# Patient Record
Sex: Female | Born: 1945 | Race: White | Hispanic: No
Health system: Southern US, Community
[De-identification: ages and names within clinical notes are randomized; demographics above are authoritative.]

## PROBLEM LIST (undated history)

## (undated) DIAGNOSIS — F329 Major depressive disorder, single episode, unspecified: Secondary | ICD-10-CM

## (undated) DIAGNOSIS — F32A Depression, unspecified: Secondary | ICD-10-CM

## (undated) HISTORY — DX: Depression, unspecified: F32.A

## (undated) HISTORY — DX: Major depressive disorder, single episode, unspecified: F32.9

---

## 1997-10-19 ENCOUNTER — Ambulatory Visit (HOSPITAL_COMMUNITY): Admission: RE | Admit: 1997-10-19 | Discharge: 1997-10-19 | Payer: Self-pay | Admitting: Orthopedic Surgery

## 1997-11-05 ENCOUNTER — Ambulatory Visit (HOSPITAL_BASED_OUTPATIENT_CLINIC_OR_DEPARTMENT_OTHER): Admission: RE | Admit: 1997-11-05 | Discharge: 1997-11-05 | Payer: Self-pay | Admitting: Orthopedic Surgery

## 1997-11-12 ENCOUNTER — Encounter: Admission: RE | Admit: 1997-11-12 | Discharge: 1998-02-10 | Payer: Self-pay | Admitting: Orthopedic Surgery

## 1998-01-09 ENCOUNTER — Ambulatory Visit (HOSPITAL_BASED_OUTPATIENT_CLINIC_OR_DEPARTMENT_OTHER): Admission: RE | Admit: 1998-01-09 | Discharge: 1998-01-09 | Payer: Self-pay | Admitting: Orthopedic Surgery

## 1998-09-25 ENCOUNTER — Other Ambulatory Visit: Admission: RE | Admit: 1998-09-25 | Discharge: 1998-09-25 | Payer: Self-pay | Admitting: Obstetrics and Gynecology

## 1999-07-20 ENCOUNTER — Encounter: Payer: Self-pay | Admitting: Oncology

## 1999-07-20 ENCOUNTER — Encounter: Admission: RE | Admit: 1999-07-20 | Discharge: 1999-07-20 | Payer: Self-pay | Admitting: Oncology

## 1999-08-12 ENCOUNTER — Encounter: Payer: Self-pay | Admitting: Emergency Medicine

## 1999-08-12 ENCOUNTER — Emergency Department (HOSPITAL_COMMUNITY): Admission: EM | Admit: 1999-08-12 | Discharge: 1999-08-12 | Payer: Self-pay | Admitting: Emergency Medicine

## 2000-02-26 ENCOUNTER — Encounter: Payer: Self-pay | Admitting: Emergency Medicine

## 2000-02-27 ENCOUNTER — Encounter: Payer: Self-pay | Admitting: Emergency Medicine

## 2000-02-27 ENCOUNTER — Observation Stay (HOSPITAL_COMMUNITY): Admission: EM | Admit: 2000-02-27 | Discharge: 2000-02-27 | Payer: Self-pay | Admitting: Emergency Medicine

## 2000-07-20 ENCOUNTER — Encounter: Payer: Self-pay | Admitting: Oncology

## 2000-07-20 ENCOUNTER — Encounter: Admission: RE | Admit: 2000-07-20 | Discharge: 2000-07-20 | Payer: Self-pay | Admitting: Oncology

## 2001-01-23 ENCOUNTER — Encounter: Admission: RE | Admit: 2001-01-23 | Discharge: 2001-01-23 | Payer: Self-pay | Admitting: Internal Medicine

## 2001-01-23 ENCOUNTER — Encounter: Payer: Self-pay | Admitting: Internal Medicine

## 2001-07-26 ENCOUNTER — Encounter: Payer: Self-pay | Admitting: Oncology

## 2001-07-26 ENCOUNTER — Encounter: Admission: RE | Admit: 2001-07-26 | Discharge: 2001-07-26 | Payer: Self-pay | Admitting: Oncology

## 2002-06-12 ENCOUNTER — Other Ambulatory Visit: Admission: RE | Admit: 2002-06-12 | Discharge: 2002-06-12 | Payer: Self-pay | Admitting: Obstetrics and Gynecology

## 2002-07-27 ENCOUNTER — Encounter: Payer: Self-pay | Admitting: Internal Medicine

## 2002-07-27 ENCOUNTER — Encounter: Admission: RE | Admit: 2002-07-27 | Discharge: 2002-07-27 | Payer: Self-pay | Admitting: Internal Medicine

## 2003-05-03 ENCOUNTER — Encounter: Admission: RE | Admit: 2003-05-03 | Discharge: 2003-05-03 | Payer: Self-pay | Admitting: Internal Medicine

## 2003-07-30 ENCOUNTER — Encounter: Admission: RE | Admit: 2003-07-30 | Discharge: 2003-07-30 | Payer: Self-pay | Admitting: Internal Medicine

## 2003-08-08 ENCOUNTER — Ambulatory Visit (HOSPITAL_COMMUNITY): Admission: RE | Admit: 2003-08-08 | Discharge: 2003-08-08 | Payer: Self-pay | Admitting: Gastroenterology

## 2003-09-17 ENCOUNTER — Ambulatory Visit (HOSPITAL_COMMUNITY): Admission: RE | Admit: 2003-09-17 | Discharge: 2003-09-17 | Payer: Self-pay | Admitting: General Surgery

## 2004-04-16 IMAGING — CT CT PELVIS W/ CM
1 series · 15 of 32 positions shown, 19 images · IV contrast (GASTRO. & OMNIPAQUE [ID])
Comparison: none

CLINICAL DATA: Abdominal pain, nausea, weight loss.  Multiple drug allergies.  Hypertension.  Breast cancer.  History of renal calculi.  Surgery left kidney for stones.  CON ? V14.8.
 CT ABDOMEN WITH CONTRAST
 Following oral contrast and IV administration of 100 cc of Omnipaque 300, multidetector spiral axial images through the abdomen, when compared with the prior study of 01/23/01, shows lesser fatty change of the liver.  Currently, a 13 mm posterior superior right lobe of the liver focus is seen with incidental hemangioma (image 15-16 which shows slight filling in and hyperdensity on delayed image 93).  No other focal liver lesions are seen.   Old appearing lateral probable right 8th rib fracture deformity is seen with no other osseous lesions noted.  The remaining abdominal organs appear normal with no new inflammation nor adenopathy.  
 IMPRESSION
 Since 01/23/01:
   Left diffuse      fatty infiltration of the liver with right lobe liver focus consistent      with incidental 13 mm hemangioma.
  Right lateral      old appearing rib fracture deformity ? probable 8th.
  Otherwise      normal.

[Series 2: — · axial · 0.70mm/px · z∈[-421,-36]mm · 15 of 119 slices shown, 19 images]
[im 8/119  soft-tissue]
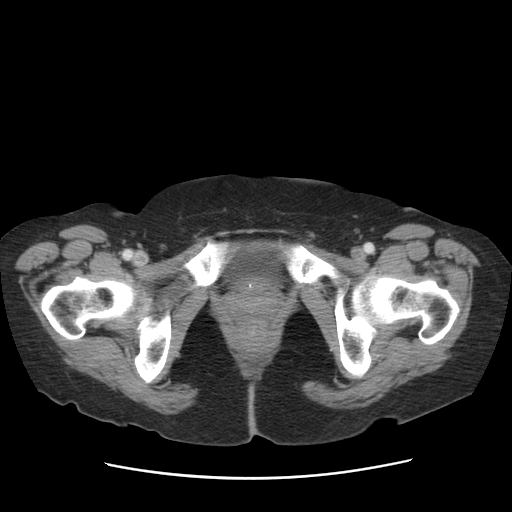
[im 8/119  bone]
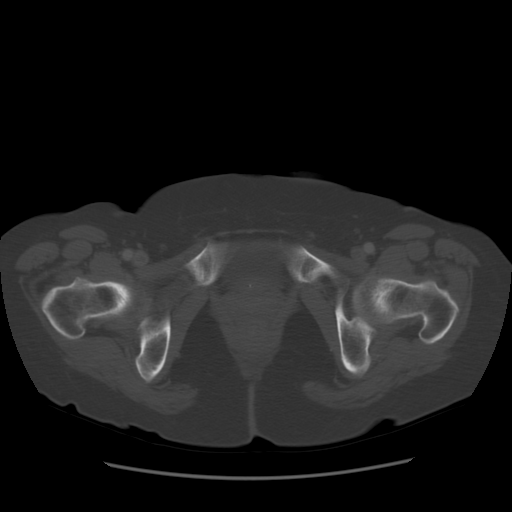
[im 16/119  soft-tissue]
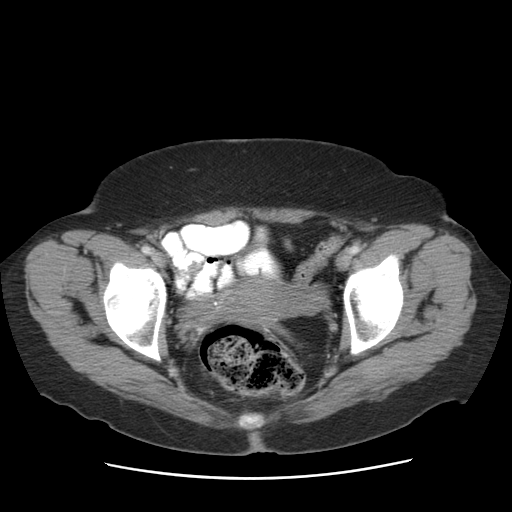
[im 23/119  soft-tissue]
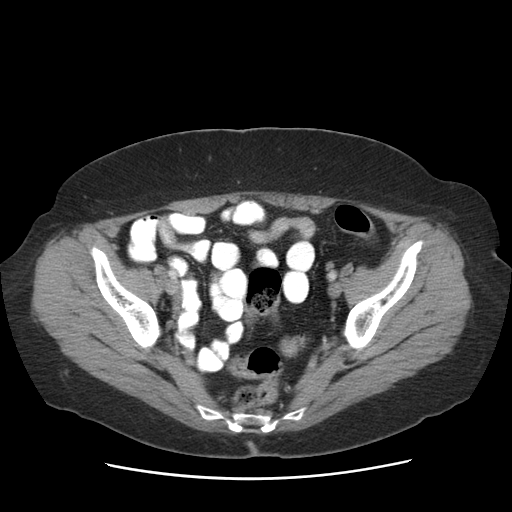
[im 35/119  soft-tissue]
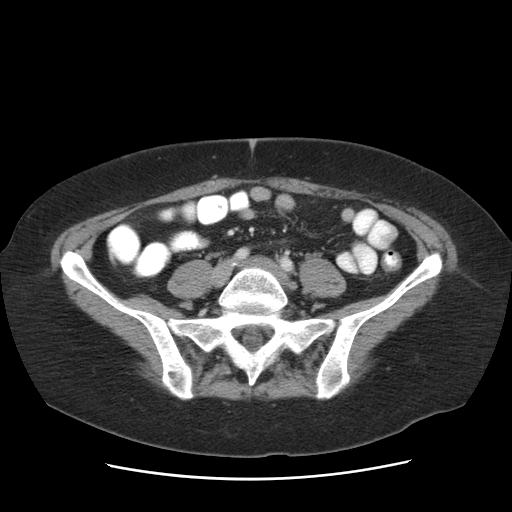
[im 42/119  soft-tissue]
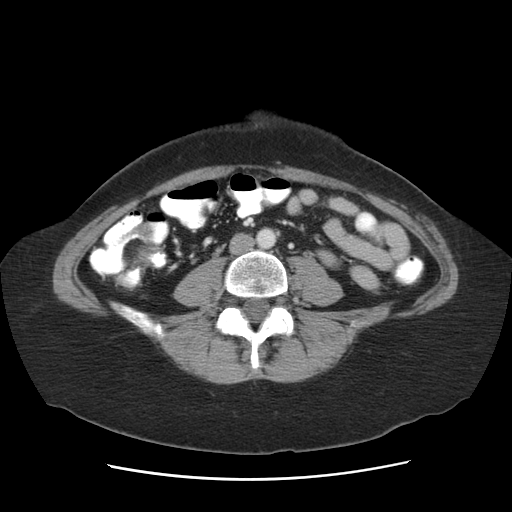
[im 50/119  soft-tissue]
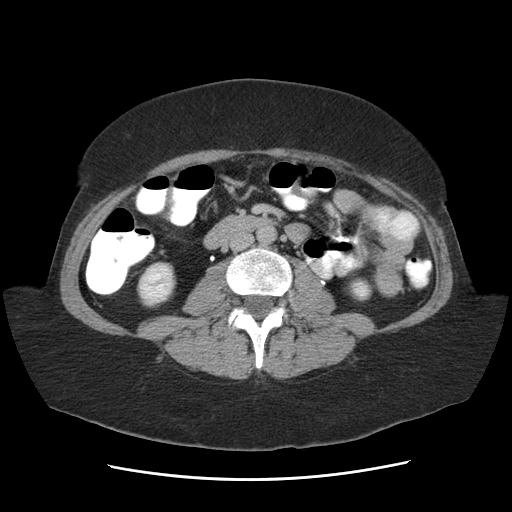
[im 61/119  soft-tissue]
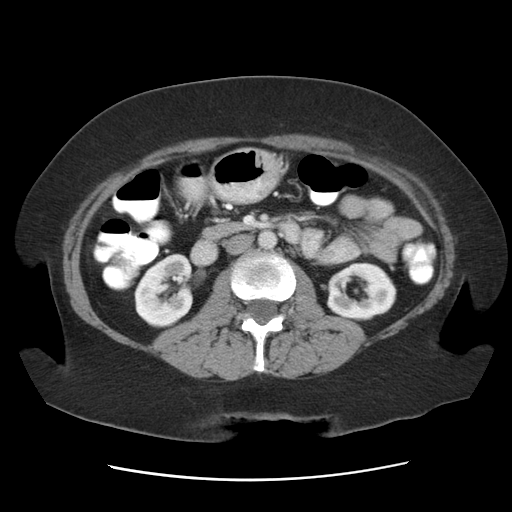
[im 69/119  soft-tissue]
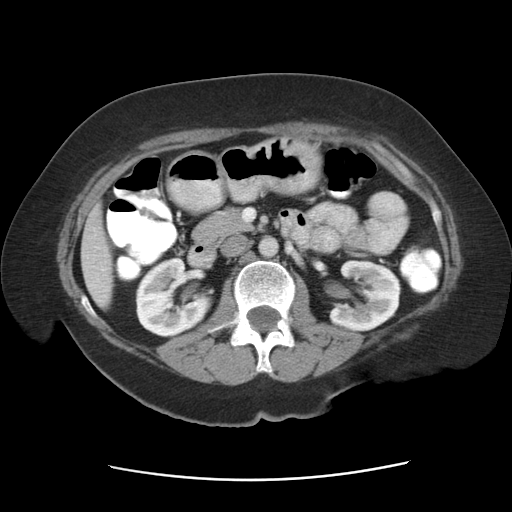
[im 77/119  soft-tissue]
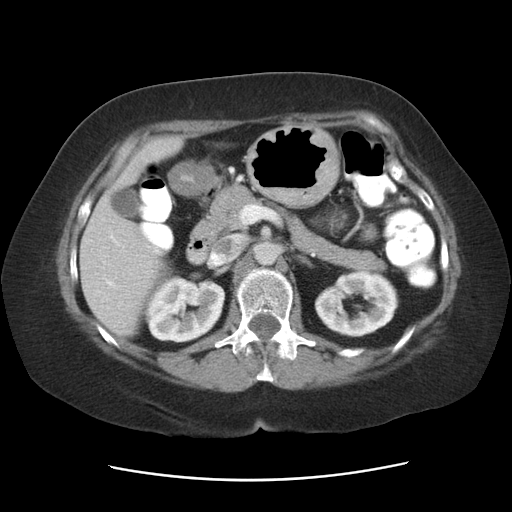
[im 77/119  bone]
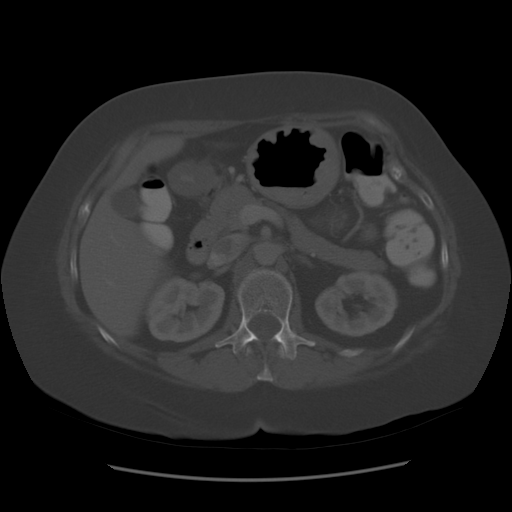
[im 84/119  soft-tissue]
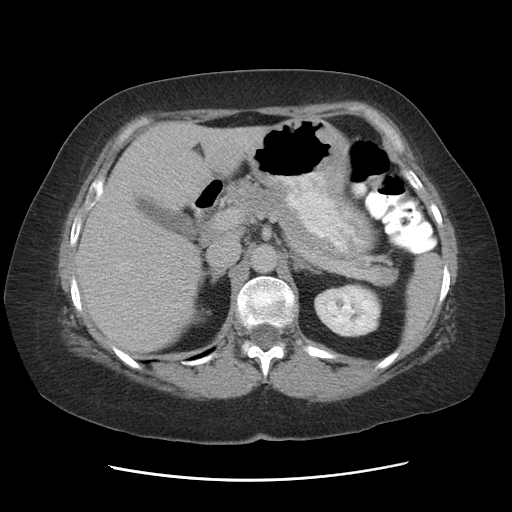
[im 96/119  soft-tissue]
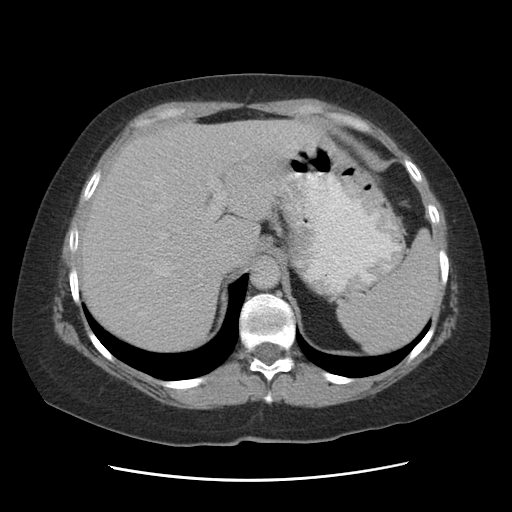
[im 103/119  soft-tissue]
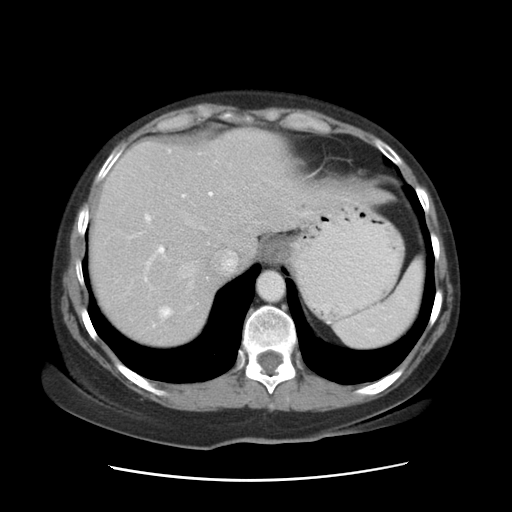
[im 103/119  lung]
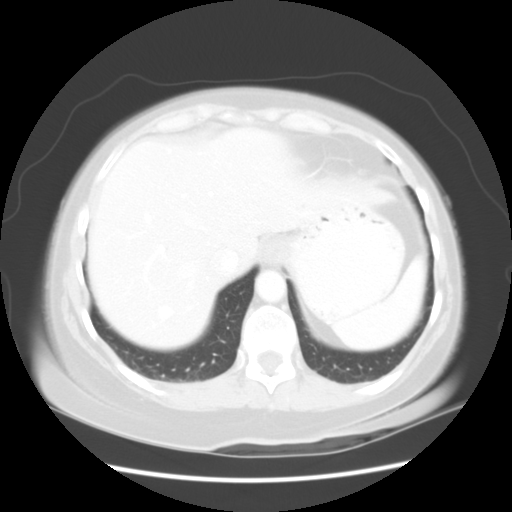
[im 107/119  lung]
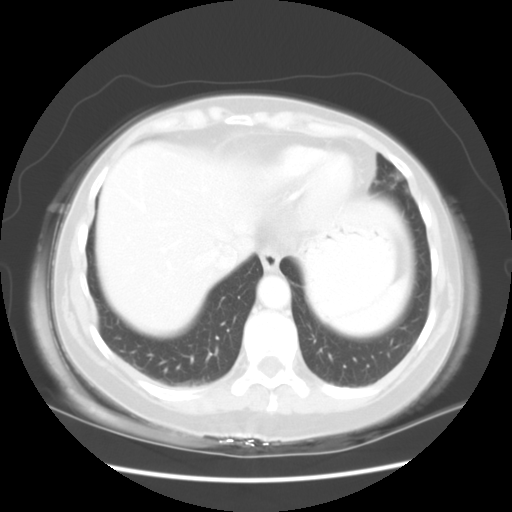
[im 111/119  soft-tissue]
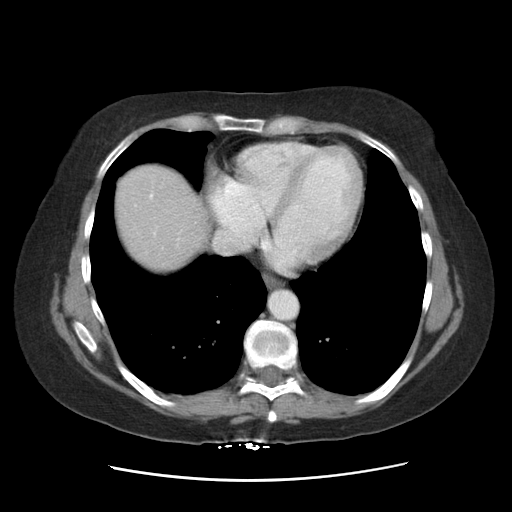
[im 111/119  lung]
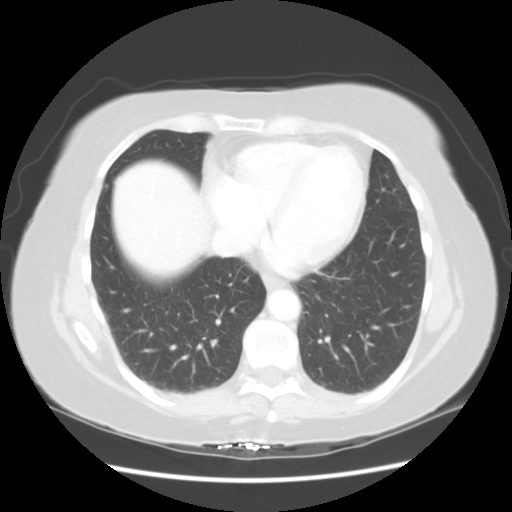
[im 115/119  lung]
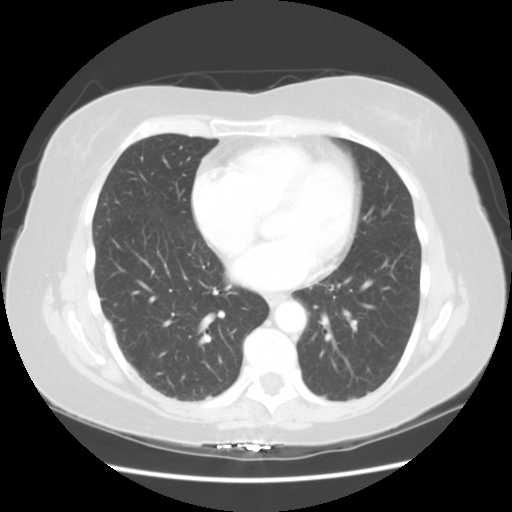

[15 of 32 positions shown; findings below may reference images not displayed]

CT PELVIS WITH CONTRAST
 Routine spiral CT of the pelvis was performed.  Omnipaque intravenous contrast and oral contrast were administered.
 The pelvic structures are normal in appearance.  There is no evidence of masses, adenopathy, inflammatory process, or abnormal fluid collections.  No interval change since 01/23/01.
 IMPRESSION
 Normal pelvis CT.

## 2004-08-05 ENCOUNTER — Encounter: Admission: RE | Admit: 2004-08-05 | Discharge: 2004-08-05 | Payer: Self-pay | Admitting: Internal Medicine

## 2004-08-27 IMAGING — CR DG CHEST 2V
2 series · 2 of 2 positions shown · non-contrast
Comparison: 02/26/00.

CLINICAL DATA: Preoperative respiratory exam for anal fissures.
 CHEST, TWO VIEWS 09/13/03

[view not recorded (1 of 2)]
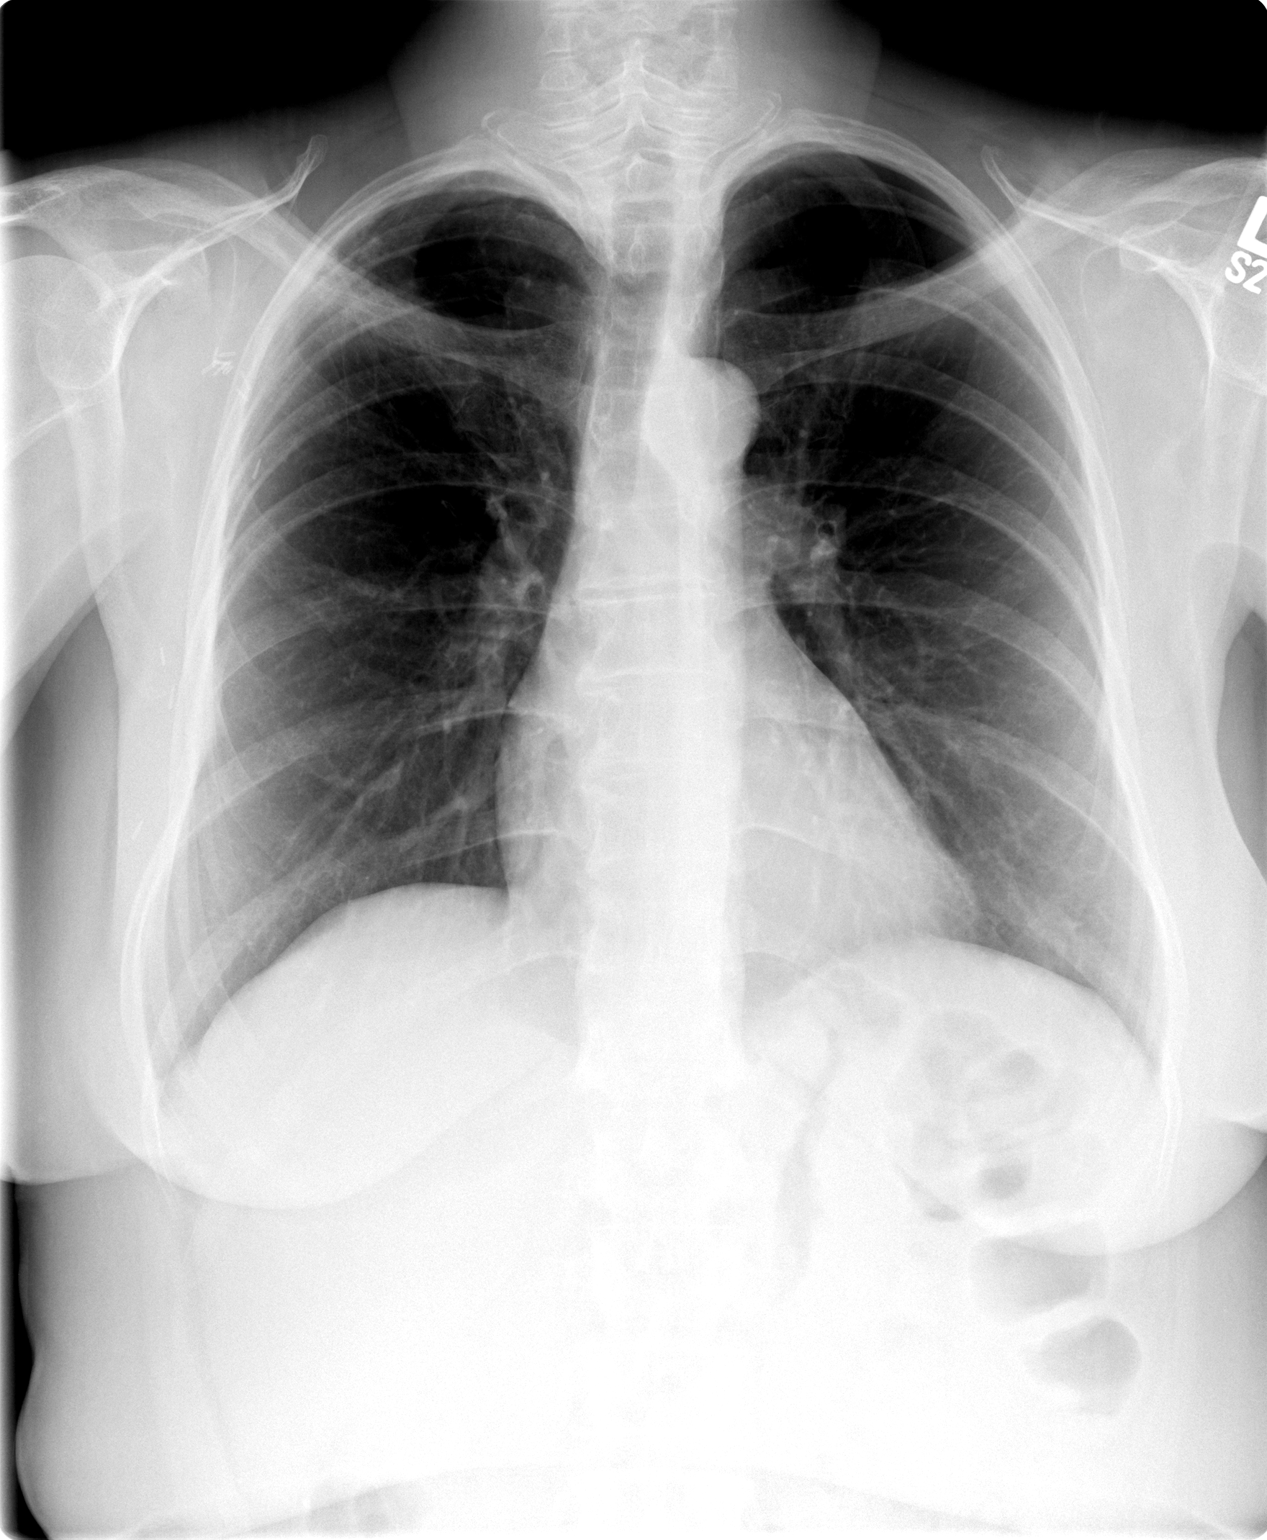

[view not recorded (2 of 2)]
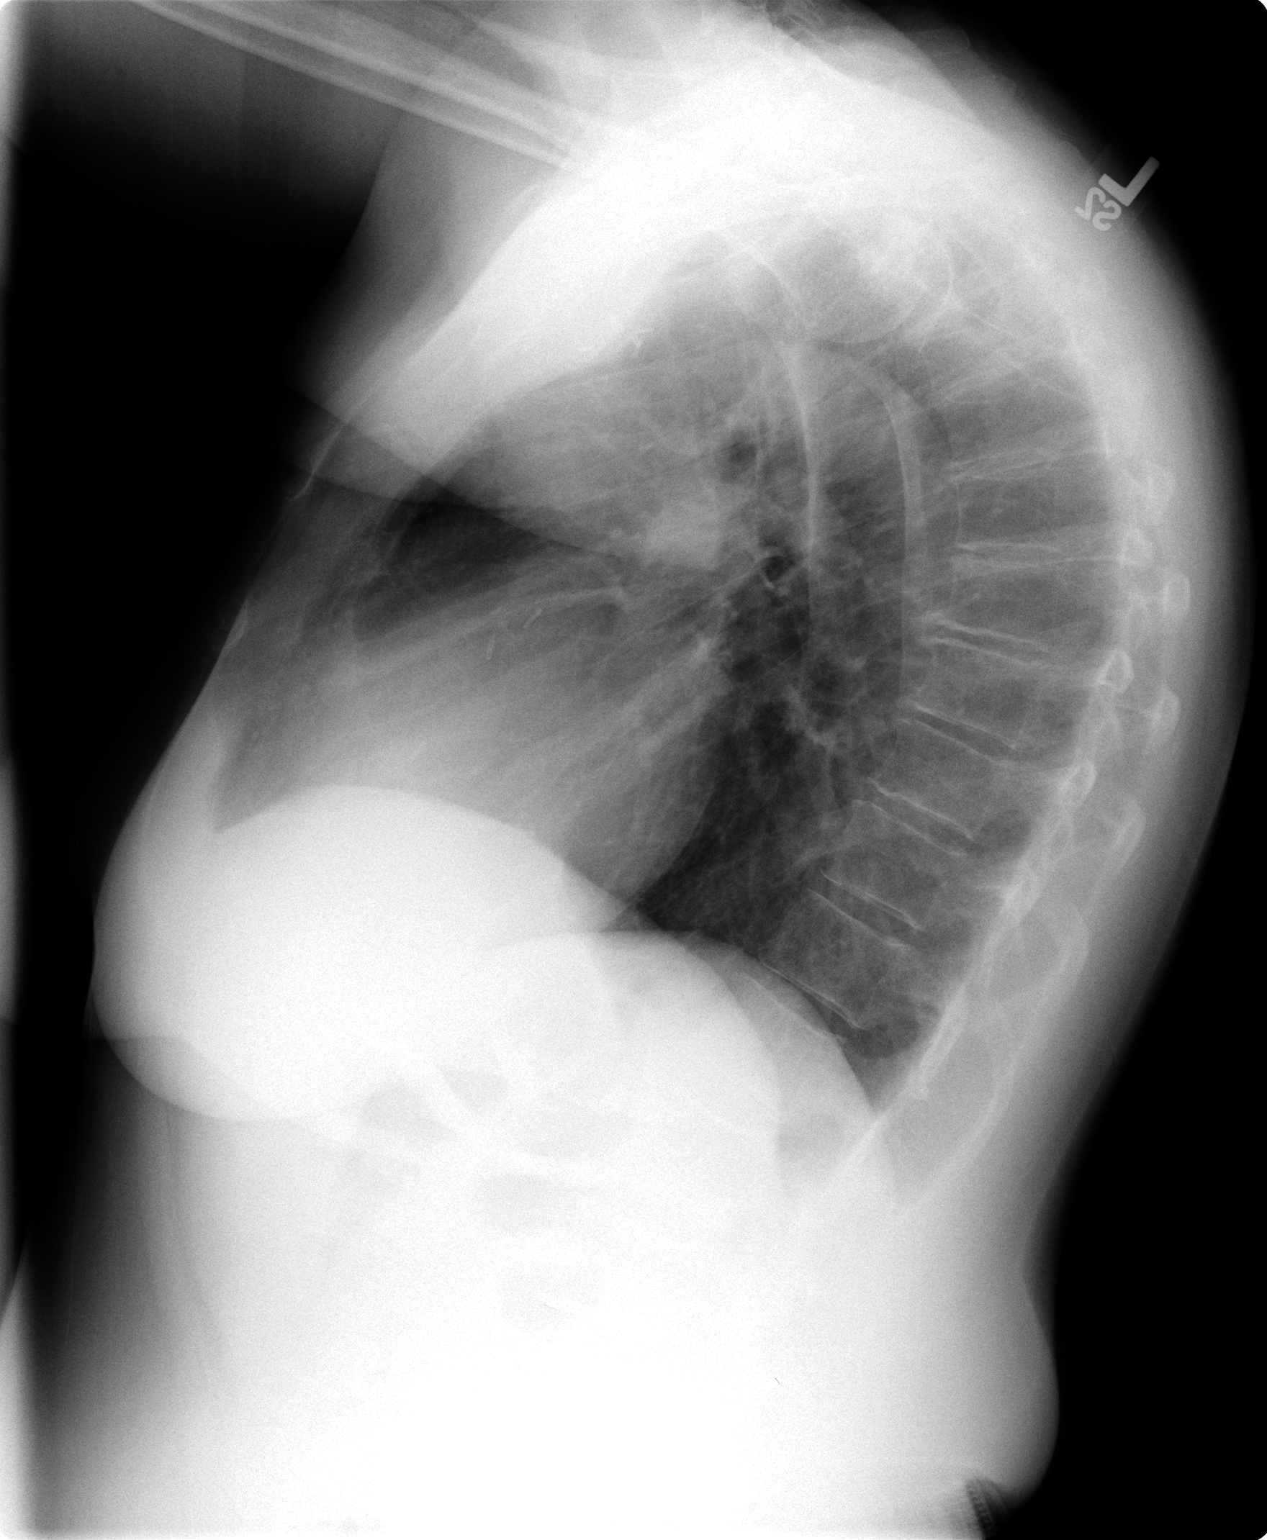

[2 of 2 positions shown; findings below may reference images not displayed]

Stable appearance of the chest without evidence of infiltrate, edema, nodule, or pleural effusion.  The heart size is normal.  Visualized bony thorax is unremarkable. 
 IMPRESSION
 No active disease.

## 2005-08-09 ENCOUNTER — Encounter: Admission: RE | Admit: 2005-08-09 | Discharge: 2005-08-09 | Payer: Self-pay | Admitting: Obstetrics and Gynecology

## 2006-08-19 ENCOUNTER — Encounter: Admission: RE | Admit: 2006-08-19 | Discharge: 2006-08-19 | Payer: Self-pay | Admitting: Family Medicine

## 2018-02-28 DIAGNOSIS — F419 Anxiety disorder, unspecified: Secondary | ICD-10-CM | POA: Insufficient documentation

## 2018-03-29 ENCOUNTER — Ambulatory Visit: Payer: Self-pay | Admitting: Psychiatry

## 2018-05-16 ENCOUNTER — Other Ambulatory Visit: Payer: Self-pay | Admitting: Psychiatry

## 2019-08-05 ENCOUNTER — Ambulatory Visit: Payer: Medicare HMO | Attending: Internal Medicine

## 2019-08-05 DIAGNOSIS — Z23 Encounter for immunization: Secondary | ICD-10-CM | POA: Insufficient documentation

## 2019-08-05 NOTE — Progress Notes (Signed)
   Covid-19 Vaccination [facility]  Name:  Anna-Marie Coller. Soltis    MRN: [id number] DOB: 1945-09-19  08/05/2019  Ms. Matty was observed post Covid-19 immunization for 15 minutes without incidence. She was provided with Vaccine Information Sheet and instruction to access the V-Safe system.   Ms. Alonge was instructed to call 911 with any severe reactions post vaccine: Marland Kitchen Difficulty breathing  . Swelling of your face and throat  . A fast heartbeat  . A bad rash all over your body  . Dizziness and weakness    Immunizations Administered    Name Date Dose VIS Date Route   Pfizer COVID-19 Vaccine 08/05/2019  5:16 PM 0.3 mL 06/08/2019 Intramuscular   Manufacturer: [employer], [employer]   Lot: [id number]   NDC: [address]-7

## 2019-09-01 ENCOUNTER — Ambulatory Visit: Payer: Medicare HMO | Attending: Internal Medicine

## 2019-09-01 DIAGNOSIS — Z23 Encounter for immunization: Secondary | ICD-10-CM

## 2019-09-01 NOTE — Progress Notes (Signed)
   Covid-19 Vaccination Clinic  Name:  Lavilla Delamora. Baka    MRN: [id number] DOB: [birth year]  09/01/2019  Ms. Mcbain was observed post Covid-19 immunization for 15 minutes without incident. She was provided with Vaccine Information Sheet and instruction to access the V-Safe system.   Ms. Mceachern was instructed to call 911 with any severe reactions post vaccine: Marland Kitchen Difficulty breathing  . Swelling of face and throat  . A fast heartbeat  . A bad rash all over body  . Dizziness and weakness   Immunizations Administered    Name Date Dose VIS Date Route   Pfizer COVID-19 Vaccine 09/01/2019 11:10 AM 0.3 mL 06/08/2019 Intramuscular   Manufacturer: [employer], [employer]   Lot: [id number]   NDC: [address]-3
